# Patient Record
Sex: Male | Born: 1990 | Race: Black or African American | Hispanic: No | Marital: Single | Smoking: Current some day smoker
Health system: Southern US, Community
[De-identification: ages and names within clinical notes are randomized; demographics above are authoritative.]

---
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
## 2018-12-18 ENCOUNTER — Emergency Department
Admission: EM | Admit: 2018-12-18 | Discharge: 2018-12-18 | Disposition: A | Discharge status: Home / Self Care | Payer: BC Managed Care – PPO | Attending: Student | Admitting: Student

## 2018-12-18 ENCOUNTER — Emergency Department: Payer: BC Managed Care – PPO

## 2018-12-18 ENCOUNTER — Other Ambulatory Visit: Payer: Self-pay

## 2018-12-18 ENCOUNTER — Encounter: Payer: Self-pay | Admitting: Emergency Medicine

## 2018-12-18 DIAGNOSIS — Y93I9 Activity, other involving external motion: Secondary | ICD-10-CM | POA: Diagnosis not present

## 2018-12-18 DIAGNOSIS — S46811A Strain of other muscles, fascia and tendons at shoulder and upper arm level, right arm, initial encounter: Secondary | ICD-10-CM | POA: Insufficient documentation

## 2018-12-18 DIAGNOSIS — Y92410 Unspecified street and highway as the place of occurrence of the external cause: Secondary | ICD-10-CM | POA: Diagnosis not present

## 2018-12-18 DIAGNOSIS — S46911A Strain of unspecified muscle, fascia and tendon at shoulder and upper arm level, right arm, initial encounter: Secondary | ICD-10-CM

## 2018-12-18 DIAGNOSIS — S4991XA Unspecified injury of right shoulder and upper arm, initial encounter: Secondary | ICD-10-CM | POA: Diagnosis present

## 2018-12-18 DIAGNOSIS — F17210 Nicotine dependence, cigarettes, uncomplicated: Secondary | ICD-10-CM | POA: Diagnosis not present

## 2018-12-18 DIAGNOSIS — Y999 Unspecified external cause status: Secondary | ICD-10-CM | POA: Diagnosis not present

## 2018-12-18 MED ORDER — CYCLOBENZAPRINE HCL 5 MG PO TABS: 5.0000 mg | ORAL_TABLET | Freq: Three times a day (TID) | ORAL | 0 refills | Status: AC | PRN

## 2018-12-18 MED ORDER — HYDROCODONE-ACETAMINOPHEN 5-325 MG PO TABS
1.0000 | ORAL_TABLET | Freq: Once | ORAL | Status: AC
  Administered 2018-12-18: 1 via ORAL
  Filled 2018-12-18: qty 1

## 2018-12-18 MED ORDER — HYDROCODONE-ACETAMINOPHEN 5-325 MG PO TABS: 1.0000 | ORAL_TABLET | Freq: Three times a day (TID) | ORAL | 0 refills | Status: AC | PRN
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
## 2022-11-04 ENCOUNTER — Emergency Department
Admission: EM | Admit: 2022-11-04 | Discharge: 2022-11-04 | Disposition: A | Discharge status: Home / Self Care | Payer: BC Managed Care – PPO | Attending: Emergency Medicine | Admitting: Emergency Medicine

## 2022-11-04 ENCOUNTER — Other Ambulatory Visit: Payer: Self-pay

## 2022-11-04 ENCOUNTER — Encounter: Payer: Self-pay | Admitting: Emergency Medicine

## 2022-11-04 DIAGNOSIS — Y990 Civilian activity done for income or pay: Secondary | ICD-10-CM | POA: Insufficient documentation

## 2022-11-04 DIAGNOSIS — W268XXA Contact with other sharp object(s), not elsewhere classified, initial encounter: Secondary | ICD-10-CM | POA: Insufficient documentation

## 2022-11-04 DIAGNOSIS — S51811A Laceration without foreign body of right forearm, initial encounter: Secondary | ICD-10-CM | POA: Insufficient documentation

## 2022-11-04 DIAGNOSIS — Z23 Encounter for immunization: Secondary | ICD-10-CM | POA: Insufficient documentation

## 2022-11-04 MED ORDER — TETANUS-DIPHTH-ACELL PERTUSSIS 5-2.5-18.5 LF-MCG/0.5 IM SUSY
0.5000 mL | PREFILLED_SYRINGE | Freq: Once | INTRAMUSCULAR | Status: AC
  Administered 2022-11-04: 0.5 mL via INTRAMUSCULAR
  Filled 2022-11-04: qty 0.5

## 2022-11-04 MED ORDER — LIDOCAINE-EPINEPHRINE 2 %-1:100000 IJ SOLN
20.0000 mL | Freq: Once | INTRAMUSCULAR | Status: AC
  Administered 2022-11-04: 20 mL via INTRADERMAL
  Filled 2022-11-04: qty 1

## 2022-11-04 MED ORDER — LIDOCAINE HCL (PF) 1 % IJ SOLN: 5.0000 mL | Freq: Once | INTRAMUSCULAR | Status: DC

## 2022-11-04 NOTE — ED Notes (Signed)
First Nurse Note: Pt to ED via [facility] for large laceration on the right arm. Gavin Potters reports that pt lost a good amount of blood, pressure dressing is currently in place.

## 2022-11-04 NOTE — Discharge Instructions (Addendum)
Stitches need to be taken out in 7-10 days, you can return to the ED, go to Urgent Care or your primary care provider for this.   Keep an eye out for signs of infection including warmth, redness, purulent drainage from the wound.
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
## 2022-11-04 NOTE — ED Notes (Signed)
Dressing applied to pt arm, and education provided

## 2022-11-04 NOTE — ED Provider Notes (Signed)
[facility] Provider Note    Event Date/Time   First MD Initiated Contact with Patient 11/04/22 1101     (approximate)   History   Laceration   HPI  Jeffery Fritz is a 32 y.o. male with no PMH who was sent over by [facility] clinic for a right forearm laceration.  Patient states he cut his forearm on a metal bolt at work.  He does not know when his last tetanus shot was.  He states he is not filing this under Workmen's Comp.      Physical Exam   Triage Vital Signs: ED Triage Vitals [11/04/22 1029]  Enc Vitals Group     BP (!) 136/98     Pulse Rate 61     Resp 16     Temp 98.5 F (36.9 C)     Temp Source Oral     SpO2 99 %     Weight 180 lb (81.6 kg)     Height 5\' 11"  (1.803 m)     Head Circumference      Peak Flow      Pain Score 2     Pain Loc      Pain Edu?      Excl. in GC?     Most recent vital signs: Vitals:   11/04/22 1029  BP: (!) 136/98  Pulse: 61  Resp: 16  Temp: 98.5 F (36.9 C)  SpO2: 99%    General: Awake, no distress.  CV:  Good peripheral perfusion.  Resp:  Normal effort.  Abd:  No distention.  Other:  5 cm laceration to the right forearm with subcutaneous fat exposed, ROM of fingers, wrist, elbow intact, radial pulse 2+ and regular, sensation intact across all dermatomes.    ED Results / Procedures / Treatments   Labs (all labs ordered are listed, but only abnormal results are displayed) Labs Reviewed - No data to display     PROCEDURES:  Critical Care performed: No  ..Laceration Repair  Date/Time: 11/04/2022 1:45 PM  Performed by: Cameron Ali, PA-C Authorized by: Cameron Ali, PA-C   Consent:    Consent obtained:  Verbal   Consent given by:  Patient   Risks discussed:  Infection, pain and poor cosmetic result   Alternatives discussed:  No treatment Anesthesia:    Anesthesia method:  Local infiltration   Local anesthetic:  Lidocaine 1% WITH epi Laceration details:    Location:   Shoulder/arm   Shoulder/arm location:  R lower arm   Length (cm):  5   Depth (mm):  3 Pre-procedure details:    Preparation:  Patient was prepped and draped in usual sterile fashion Exploration:    Limited defect created (wound extended): no     Hemostasis achieved with:  Epinephrine   Wound exploration: entire depth of wound visualized   Treatment:    Area cleansed with:  Povidone-iodine   Amount of cleaning:  Standard   Irrigation solution:  Sterile saline   Irrigation method:  Syringe Skin repair:    Repair method:  Sutures   Suture size:  4-0   Suture material:  Prolene   Suture technique:  Running and simple interrupted   Number of sutures:  4 Approximation:    Approximation:  Close Repair type:    Repair type:  Simple Post-procedure details:    Dressing:  Bulky dressing   Procedure completion:  Tolerated well, no immediate complications    MEDICATIONS ORDERED IN ED:  Medications  Tdap (BOOSTRIX) injection 0.5 mL (has no administration in time range)  lidocaine-EPINEPHrine (XYLOCAINE W/EPI) 2 %-1:100000 (with pres) injection 20 mL (20 mLs Intradermal Given 11/04/22 1346)     IMPRESSION / MDM / ASSESSMENT AND PLAN / ED COURSE  I reviewed the triage vital signs and the nursing notes.                             Patient presents to the ED with a right forearm laceration. Differential diagnosis includes, but is not limited to, laceration, tendon injury, muscular injury, nerve injury.  Patient's presentation is most consistent with acute, uncomplicated illness.  Patient was unaware of when his last tetanus shot was so this was updated.  See procedure note above for details on the wound closure.  Patient instructed on wound care.  I explained that he will need to be seen by another provider in 7 to 10 days for suture removal.  Patient voiced understanding, all questions were answered and he was stable at discharge.      FINAL CLINICAL IMPRESSION(S) / ED DIAGNOSES    Final diagnoses:  Laceration of right forearm, initial encounter     Rx / DC Orders   ED Discharge Orders     None        Note:  This document was prepared using Dragon voice recognition software and may include unintentional dictation errors.   Cameron Ali, PA-C 11/04/22 1348    Dionne Bucy, MD 11/04/22 2017
